# Patient Record
Sex: Male | Born: 1985 | Race: Black or African American | Hispanic: No | Marital: Single | Smoking: Never smoker
Health system: Southern US, Community
[De-identification: ages and names within clinical notes are randomized; demographics above are authoritative.]

---

## 2019-10-14 ENCOUNTER — Other Ambulatory Visit: Payer: Self-pay

## 2019-10-14 ENCOUNTER — Ambulatory Visit (INDEPENDENT_AMBULATORY_CARE_PROVIDER_SITE_OTHER): Payer: Worker's Compensation

## 2019-10-14 ENCOUNTER — Encounter (HOSPITAL_COMMUNITY): Payer: Self-pay

## 2019-10-14 ENCOUNTER — Ambulatory Visit (HOSPITAL_COMMUNITY)
Admission: EM | Admit: 2019-10-14 | Discharge: 2019-10-14 | Disposition: A | Discharge status: Home / Self Care | Payer: Worker's Compensation | Attending: Family Medicine | Admitting: Family Medicine

## 2019-10-14 ENCOUNTER — Ambulatory Visit (HOSPITAL_COMMUNITY): Payer: BC Managed Care – PPO

## 2019-10-14 DIAGNOSIS — M25522 Pain in left elbow: Secondary | ICD-10-CM | POA: Diagnosis not present

## 2019-10-14 DIAGNOSIS — X500XXA Overexertion from strenuous movement or load, initial encounter: Secondary | ICD-10-CM

## 2019-10-14 DIAGNOSIS — R030 Elevated blood-pressure reading, without diagnosis of hypertension: Secondary | ICD-10-CM

## 2019-10-14 MED ORDER — DICLOFENAC SODIUM 75 MG PO TBEC: 75.0000 mg | DELAYED_RELEASE_TABLET | Freq: Two times a day (BID) | ORAL | 0 refills | Status: AC

## 2019-10-14 NOTE — ED Triage Notes (Signed)
Patient presents to Urgent Care with complaints of left elbow pain since lifting something heavy yesterday. Patient reports he heard a "pop", and the pain has been severe ever since, states pain is 10/10.

## 2019-10-14 NOTE — Discharge Instructions (Addendum)
Your blood pressure was noted to be elevated during your visit today. You may return here within the next few days to recheck if unable to see your primary care doctor. If your blood pressure remains persistently elevated, you may need to begin taking a medication.  BP (!) 153/84 (BP Location: Right Arm)   Pulse 61   Temp 97.8 F (36.6 C) (Oral)   Resp 17   SpO2 99%

## 2019-10-16 NOTE — ED Provider Notes (Signed)
[place]   [id number] 10/14/19 Arrival Time: [place]:  1. Left elbow pain   2. Elevated blood pressure reading without diagnosis of hypertension     I have personally viewed the imaging studies ordered this visit. No fractures or other abnormalities appreciated.  Question L biceps muscle strain; no signs of a rupture. Rest L arm; work note given. Begin trial of: Meds ordered this encounter  Medications  . diclofenac (VOLTAREN) 75 MG EC tablet    Sig: Take 1 tablet (75 mg total) by mouth 2 (two) times daily.    Dispense:  14 tablet    Refill:  0    Orders Placed This Encounter  Procedures  . DG Elbow Complete Left    Recommend: Follow-up Information    Schedule an appointment as soon as possible for a visit  with [facility].   Why: [address] [place], [place] [address]  [phone]           Discharge Instructions     Your blood pressure was noted to be elevated during your visit today. You may return here within the next few days to recheck if unable to see your primary care doctor. If your blood pressure remains persistently elevated, you may need to begin taking a medication.  BP (!) 153/84 (BP Location: Right Arm)   Pulse 61   Temp 97.8 F (36.6 C) (Oral)   Resp 17   SpO2 99%          Reviewed expectations re: course of current medical issues. Questions answered. Outlined signs and symptoms indicating need for more acute intervention. Patient verbalized understanding. After Visit Summary given.  SUBJECTIVE: History from: patient. Worker's comp injury.  Corey Koch is a 34 y.o. male who reports fairly persistent marked pain of his left elbow after lifting an appliance onto a truck with co-worker helping. Yesterday. Reports hearing "a pop" and feeling pain in elbow. Pain described as aching and burning; without radiation. Reports pain is 10/10. Symptoms have progressed to a  point and plateaued since beginning. Aggravating factors: certain movements. Alleviating factors: have not been identified. Associated symptoms: none reported. Extremity sensation changes or weakness: none. Self treatment: none reported.  History of similar: no.  History reviewed. No pertinent surgical history.   Increased blood pressure noted today. Reports that he has not been treated for hypertension in the past.  He reports no chest pain on exertion, no dyspnea on exertion, no swelling of ankles, no orthostatic dizziness or lightheadedness, no orthopnea or paroxysmal nocturnal dyspnea, no palpitations and no intermittent claudication symptoms.  OBJECTIVE:  Vitals:   10/14/19 1326  BP: (!) 153/84  Pulse: 61  Resp: 17  Temp: 97.8 F (36.6 C)  TempSrc: Oral  SpO2: 99%    General appearance: alert; no distress HEENT: ; AT Neck: supple with FROM Resp: unlabored respirations Extremities: . LUE: warm with well perfused appearance; fairly well localized moderate tenderness over left elbow at distal biceps; without gross deformities; swelling: none; bruising: none; elbow ROM: normal, with discomfort CV: brisk extremity capillary refill of LUE; 2+ radial pulse of LUE. Skin: warm and dry; no visible rashes Neurologic: gait normal; normal sensation of LUE Psychological: alert and cooperative; normal mood and affect  Imaging: DG Elbow Complete Left  Result Date: 10/14/2019 CLINICAL DATA:  Injury with pain of left elbow. EXAM: LEFT ELBOW - COMPLETE 3+ VIEW COMPARISON:  None. FINDINGS: There is no evidence of fracture,  dislocation, or joint effusion. There is no evidence of arthropathy or other focal bone abnormality. Soft tissues are unremarkable. IMPRESSION: Negative. Electronically Signed   By: Sherian Rein M.D.   On: 10/14/2019 13:58     No Known Allergies  History reviewed. No pertinent past medical history.   Social History   Socioeconomic History  . Marital status:  Married    Spouse name: Not on file  . Number of children: Not on file  . Years of education: Not on file  . Highest education level: Not on file  Occupational History  . Not on file  Tobacco Use  . Smoking status: Never Smoker  . Smokeless tobacco: Never Used  Substance and Sexual Activity  . Alcohol use: Yes    Comment: occ  . Drug use: Not on file  . Sexual activity: Not on file  Other Topics Concern  . Not on file  Social History Narrative  . Not on file   Social Determinants of Health   Financial Resource Strain:   . Difficulty of Paying Living Expenses: Not on file  Food Insecurity:   . Worried About Programme researcher, broadcasting/film/video in the Last Year: Not on file  . Ran Out of Food in the Last Year: Not on file  Transportation Needs:   . Lack of Transportation (Medical): Not on file  . Lack of Transportation (Non-Medical): Not on file  Physical Activity:   . Days of Exercise per Week: Not on file  . Minutes of Exercise per Session: Not on file  Stress:   . Feeling of Stress : Not on file  Social Connections:   . Frequency of Communication with Friends and Family: Not on file  . Frequency of Social Gatherings with Friends and Family: Not on file  . Attends Religious Services: Not on file  . Active Member of Clubs or Organizations: Not on file  . Attends Banker Meetings: Not on file  . Marital Status: Not on file   Family History  Problem Relation Age of Onset  . Hypertension Mother   . Hypertension Father    History reviewed. No pertinent surgical history.    Mardella Layman, MD 10/16/19 [phone]

## 2020-03-18 DIAGNOSIS — Z20822 Contact with and (suspected) exposure to covid-19: Secondary | ICD-10-CM | POA: Diagnosis not present

## 2020-07-06 DIAGNOSIS — H6693 Otitis media, unspecified, bilateral: Secondary | ICD-10-CM | POA: Diagnosis not present

## 2020-07-23 DIAGNOSIS — E663 Overweight: Secondary | ICD-10-CM | POA: Diagnosis not present

## 2020-07-23 DIAGNOSIS — Z8342 Family history of familial hypercholesterolemia: Secondary | ICD-10-CM | POA: Diagnosis not present

## 2020-07-23 DIAGNOSIS — Z7185 Encounter for immunization safety counseling: Secondary | ICD-10-CM | POA: Diagnosis not present

## 2020-07-23 DIAGNOSIS — Z8249 Family history of ischemic heart disease and other diseases of the circulatory system: Secondary | ICD-10-CM | POA: Diagnosis not present

## 2020-07-23 DIAGNOSIS — Z23 Encounter for immunization: Secondary | ICD-10-CM | POA: Diagnosis not present

## 2020-07-31 DIAGNOSIS — Z13220 Encounter for screening for lipoid disorders: Secondary | ICD-10-CM | POA: Diagnosis not present

## 2020-07-31 DIAGNOSIS — Z0000 Encounter for general adult medical examination without abnormal findings: Secondary | ICD-10-CM | POA: Diagnosis not present

## 2020-08-02 DIAGNOSIS — Z0000 Encounter for general adult medical examination without abnormal findings: Secondary | ICD-10-CM | POA: Diagnosis not present

## 2020-09-29 DIAGNOSIS — Z20822 Contact with and (suspected) exposure to covid-19: Secondary | ICD-10-CM | POA: Diagnosis not present

## 2020-10-17 DIAGNOSIS — H6693 Otitis media, unspecified, bilateral: Secondary | ICD-10-CM | POA: Diagnosis not present

## 2020-11-29 DIAGNOSIS — H608X3 Other otitis externa, bilateral: Secondary | ICD-10-CM | POA: Diagnosis not present

## 2020-11-29 DIAGNOSIS — H6122 Impacted cerumen, left ear: Secondary | ICD-10-CM | POA: Diagnosis not present

## 2021-01-09 DIAGNOSIS — Z20822 Contact with and (suspected) exposure to covid-19: Secondary | ICD-10-CM | POA: Diagnosis not present

## 2021-02-28 IMAGING — DX DG ELBOW COMPLETE 3+V*L*
4 series · 4 of 4 positions shown · non-contrast
Comparison: None.

CLINICAL DATA: Injury with pain of left elbow.

EXAM:
LEFT ELBOW - COMPLETE 3+ VIEW

[elbow ap]
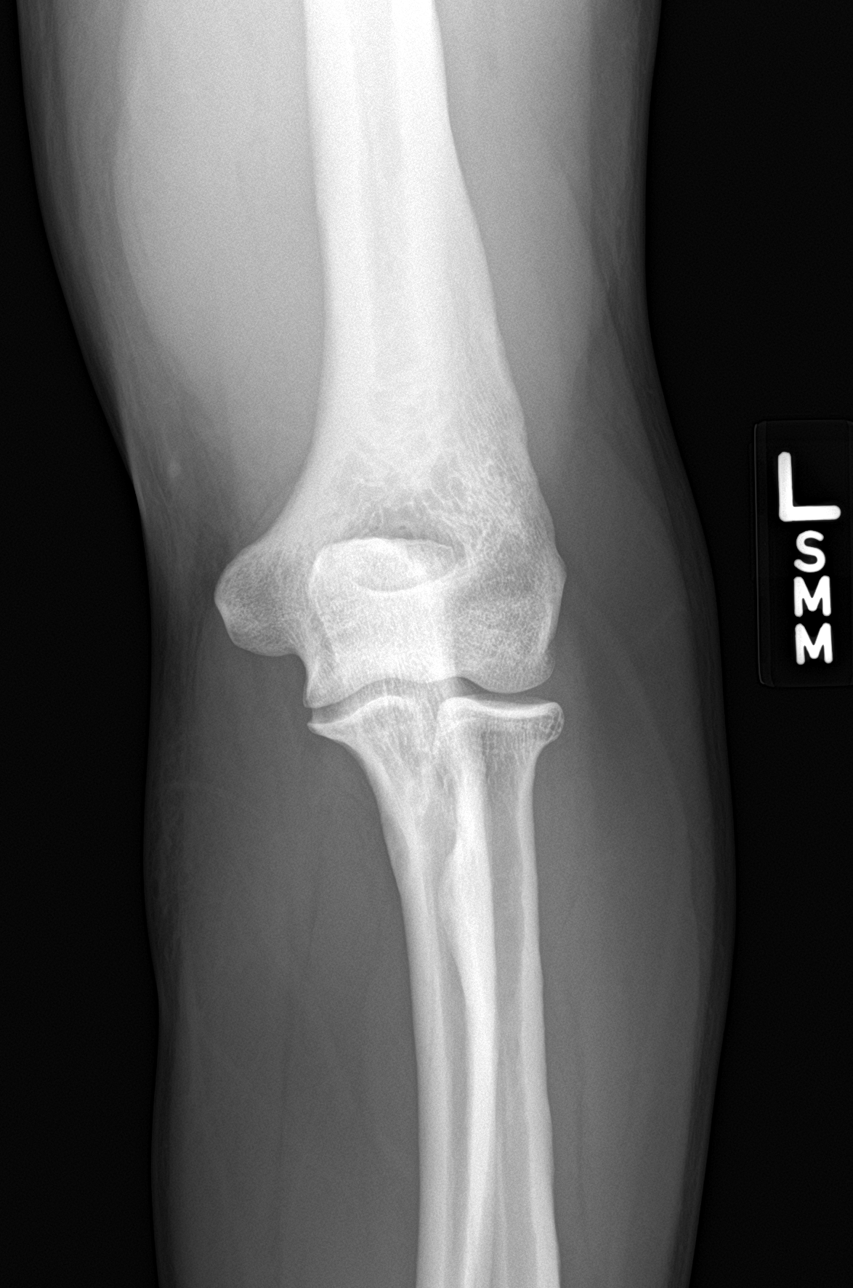

[elbow obl (1 of 2)]
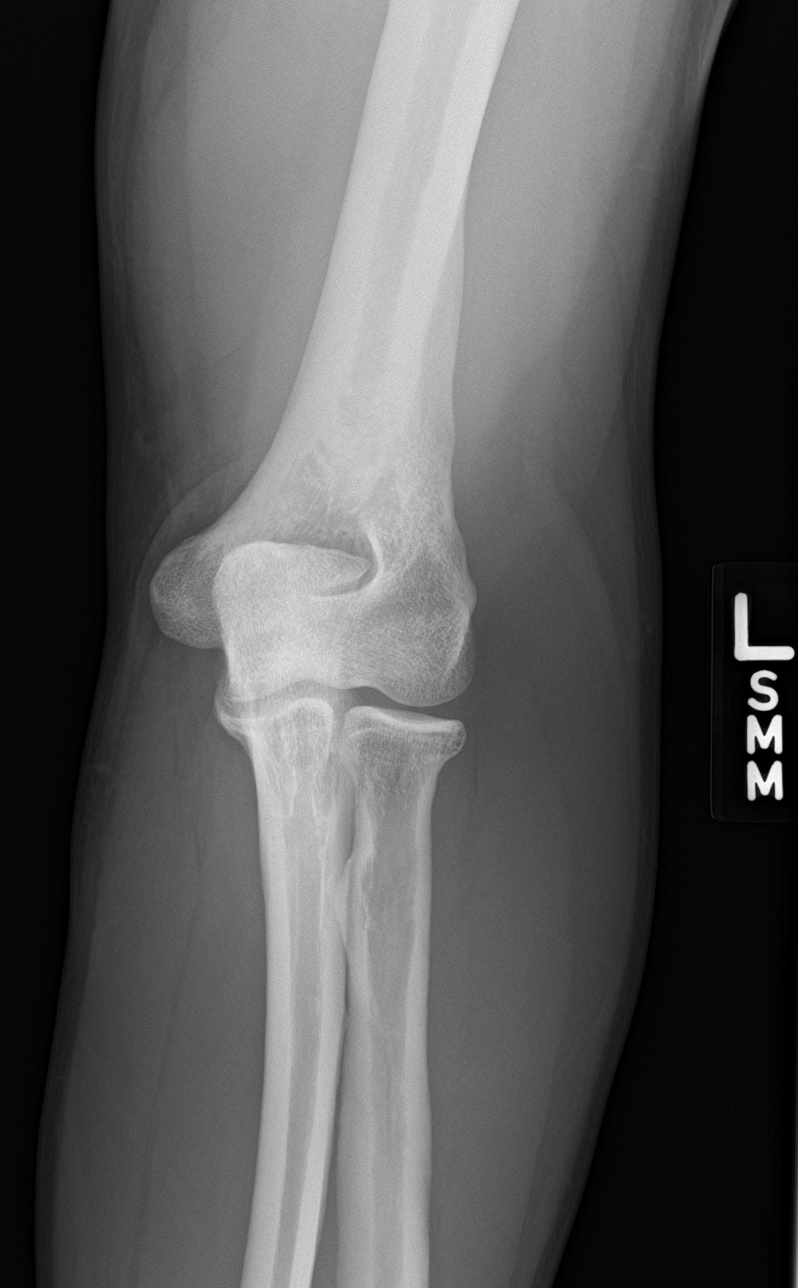

[elbow obl (2 of 2)]
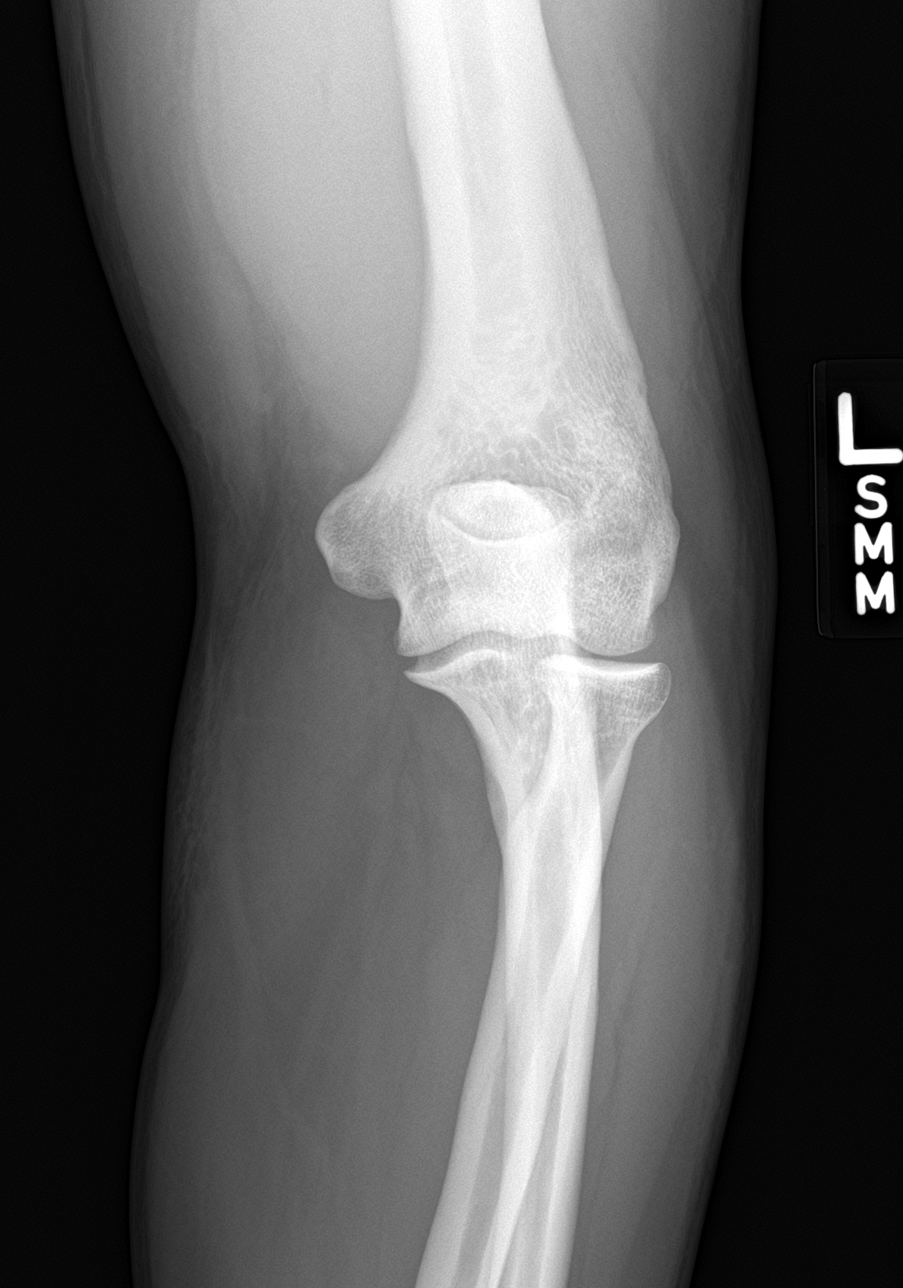

[elbow lat]
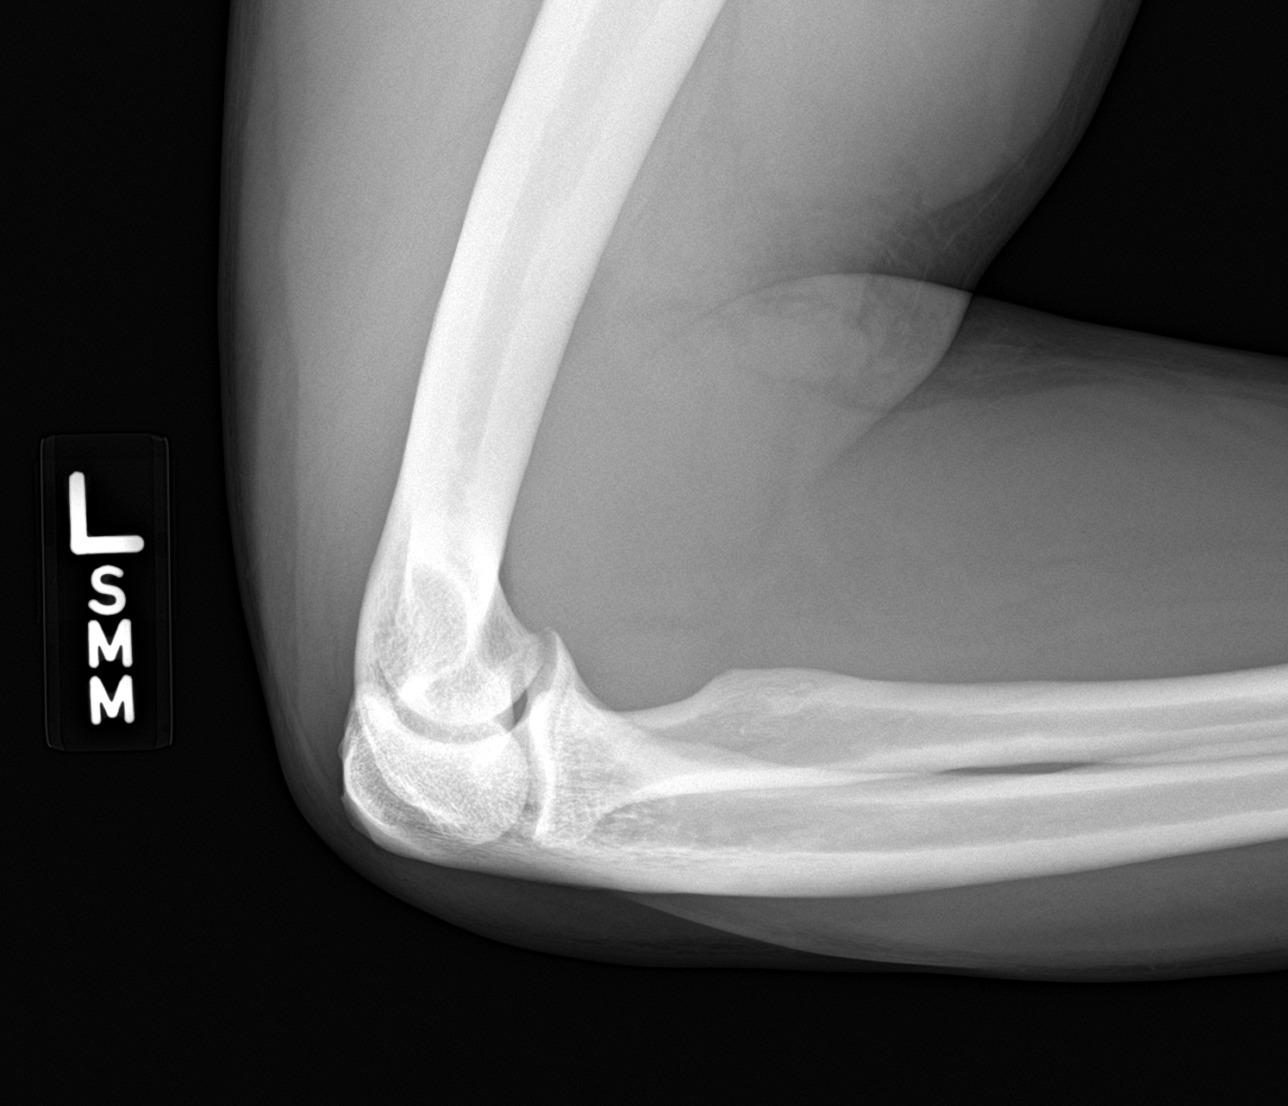

[4 of 4 positions shown; findings below may reference images not displayed]

FINDINGS: There is no evidence of fracture, dislocation, or joint effusion.
There is no evidence of arthropathy or other focal bone abnormality.
Soft tissues are unremarkable.
IMPRESSION: Negative.

## 2021-04-07 DIAGNOSIS — L309 Dermatitis, unspecified: Secondary | ICD-10-CM | POA: Diagnosis not present

## 2021-05-22 DIAGNOSIS — L209 Atopic dermatitis, unspecified: Secondary | ICD-10-CM | POA: Diagnosis not present

## 2021-05-22 DIAGNOSIS — L309 Dermatitis, unspecified: Secondary | ICD-10-CM | POA: Diagnosis not present

## 2021-06-18 DIAGNOSIS — L309 Dermatitis, unspecified: Secondary | ICD-10-CM | POA: Diagnosis not present

## 2021-06-25 DIAGNOSIS — J301 Allergic rhinitis due to pollen: Secondary | ICD-10-CM | POA: Diagnosis not present

## 2021-06-25 DIAGNOSIS — J3089 Other allergic rhinitis: Secondary | ICD-10-CM | POA: Diagnosis not present

## 2021-06-25 DIAGNOSIS — J3081 Allergic rhinitis due to animal (cat) (dog) hair and dander: Secondary | ICD-10-CM | POA: Diagnosis not present

## 2021-06-25 DIAGNOSIS — L309 Dermatitis, unspecified: Secondary | ICD-10-CM | POA: Diagnosis not present

## 2021-07-02 DIAGNOSIS — L249 Irritant contact dermatitis, unspecified cause: Secondary | ICD-10-CM | POA: Diagnosis not present

## 2021-08-07 DIAGNOSIS — Z0000 Encounter for general adult medical examination without abnormal findings: Secondary | ICD-10-CM | POA: Diagnosis not present

## 2021-08-26 DIAGNOSIS — Z0000 Encounter for general adult medical examination without abnormal findings: Secondary | ICD-10-CM | POA: Diagnosis not present

## 2021-08-26 DIAGNOSIS — R635 Abnormal weight gain: Secondary | ICD-10-CM | POA: Diagnosis not present

## 2021-08-26 DIAGNOSIS — Z23 Encounter for immunization: Secondary | ICD-10-CM | POA: Diagnosis not present

## 2021-08-26 DIAGNOSIS — R030 Elevated blood-pressure reading, without diagnosis of hypertension: Secondary | ICD-10-CM | POA: Diagnosis not present

## 2022-08-09 DIAGNOSIS — J019 Acute sinusitis, unspecified: Secondary | ICD-10-CM | POA: Diagnosis not present

## 2022-08-09 DIAGNOSIS — H1033 Unspecified acute conjunctivitis, bilateral: Secondary | ICD-10-CM | POA: Diagnosis not present

## 2022-08-25 DIAGNOSIS — R7301 Impaired fasting glucose: Secondary | ICD-10-CM | POA: Diagnosis not present

## 2022-08-25 DIAGNOSIS — Z114 Encounter for screening for human immunodeficiency virus [HIV]: Secondary | ICD-10-CM | POA: Diagnosis not present

## 2022-08-25 DIAGNOSIS — Z13220 Encounter for screening for lipoid disorders: Secondary | ICD-10-CM | POA: Diagnosis not present

## 2022-08-25 DIAGNOSIS — Z0000 Encounter for general adult medical examination without abnormal findings: Secondary | ICD-10-CM | POA: Diagnosis not present

## 2022-08-27 DIAGNOSIS — Z0000 Encounter for general adult medical examination without abnormal findings: Secondary | ICD-10-CM | POA: Diagnosis not present

## 2022-08-27 DIAGNOSIS — Z23 Encounter for immunization: Secondary | ICD-10-CM | POA: Diagnosis not present

## 2022-11-24 DIAGNOSIS — E1165 Type 2 diabetes mellitus with hyperglycemia: Secondary | ICD-10-CM | POA: Diagnosis not present

## 2022-11-24 DIAGNOSIS — E559 Vitamin D deficiency, unspecified: Secondary | ICD-10-CM | POA: Diagnosis not present

## 2022-11-26 DIAGNOSIS — R7303 Prediabetes: Secondary | ICD-10-CM | POA: Diagnosis not present

## 2022-11-26 DIAGNOSIS — E559 Vitamin D deficiency, unspecified: Secondary | ICD-10-CM | POA: Diagnosis not present

## 2022-11-26 DIAGNOSIS — R7989 Other specified abnormal findings of blood chemistry: Secondary | ICD-10-CM | POA: Diagnosis not present

## 2022-12-24 DIAGNOSIS — E559 Vitamin D deficiency, unspecified: Secondary | ICD-10-CM | POA: Diagnosis not present

## 2022-12-24 DIAGNOSIS — Z713 Dietary counseling and surveillance: Secondary | ICD-10-CM | POA: Diagnosis not present

## 2023-01-21 DIAGNOSIS — R0683 Snoring: Secondary | ICD-10-CM | POA: Diagnosis not present

## 2023-01-21 DIAGNOSIS — Z713 Dietary counseling and surveillance: Secondary | ICD-10-CM | POA: Diagnosis not present

## 2023-01-21 DIAGNOSIS — Z72821 Inadequate sleep hygiene: Secondary | ICD-10-CM | POA: Diagnosis not present

## 2023-02-23 DIAGNOSIS — I10 Essential (primary) hypertension: Secondary | ICD-10-CM | POA: Diagnosis not present

## 2023-02-23 DIAGNOSIS — R7303 Prediabetes: Secondary | ICD-10-CM | POA: Diagnosis not present

## 2023-02-25 DIAGNOSIS — R7303 Prediabetes: Secondary | ICD-10-CM | POA: Diagnosis not present

## 2023-02-25 DIAGNOSIS — R7989 Other specified abnormal findings of blood chemistry: Secondary | ICD-10-CM | POA: Diagnosis not present

## 2023-03-26 DIAGNOSIS — R030 Elevated blood-pressure reading, without diagnosis of hypertension: Secondary | ICD-10-CM | POA: Diagnosis not present

## 2023-03-26 DIAGNOSIS — R635 Abnormal weight gain: Secondary | ICD-10-CM | POA: Diagnosis not present

## 2023-04-08 ENCOUNTER — Other Ambulatory Visit (HOSPITAL_COMMUNITY): Payer: Self-pay

## 2023-04-08 MED ORDER — SEMAGLUTIDE-WEIGHT MANAGEMENT 1 MG/0.5ML ~~LOC~~ SOAJ
1.0000 mg | SUBCUTANEOUS | 3 refills | Status: AC
  Filled 2023-04-08: qty 2, 28d supply, fill #0

## 2023-04-29 ENCOUNTER — Other Ambulatory Visit (HOSPITAL_COMMUNITY): Payer: Self-pay

## 2023-04-29 DIAGNOSIS — R7303 Prediabetes: Secondary | ICD-10-CM | POA: Diagnosis not present

## 2023-04-29 DIAGNOSIS — R030 Elevated blood-pressure reading, without diagnosis of hypertension: Secondary | ICD-10-CM | POA: Diagnosis not present

## 2023-04-29 MED ORDER — WEGOVY 1 MG/0.5ML ~~LOC~~ SOAJ
1.0000 mg | SUBCUTANEOUS | 3 refills | Status: AC
  Filled 2023-04-29: qty 2, 28d supply, fill #0

## 2023-05-05 ENCOUNTER — Other Ambulatory Visit (HOSPITAL_COMMUNITY): Payer: Self-pay

## 2023-05-27 ENCOUNTER — Other Ambulatory Visit (HOSPITAL_COMMUNITY): Payer: Self-pay

## 2023-05-27 DIAGNOSIS — R7303 Prediabetes: Secondary | ICD-10-CM | POA: Diagnosis not present

## 2023-05-27 MED ORDER — WEGOVY 1.7 MG/0.75ML ~~LOC~~ SOAJ
0.7500 mL | SUBCUTANEOUS | 1 refills | Status: DC
  Filled 2023-05-27 – 2023-05-28 (×2): qty 3, 28d supply, fill #0
  Filled 2023-07-05 – 2023-07-21 (×6): qty 3, 28d supply, fill #1

## 2023-05-28 ENCOUNTER — Other Ambulatory Visit: Payer: Self-pay

## 2023-05-28 ENCOUNTER — Other Ambulatory Visit (HOSPITAL_COMMUNITY): Payer: Self-pay

## 2023-05-31 ENCOUNTER — Other Ambulatory Visit (HOSPITAL_COMMUNITY): Payer: Self-pay

## 2023-07-01 DIAGNOSIS — R030 Elevated blood-pressure reading, without diagnosis of hypertension: Secondary | ICD-10-CM | POA: Diagnosis not present

## 2023-07-05 ENCOUNTER — Other Ambulatory Visit (HOSPITAL_COMMUNITY): Payer: Self-pay

## 2023-07-06 ENCOUNTER — Other Ambulatory Visit (HOSPITAL_COMMUNITY): Payer: Self-pay

## 2023-07-08 ENCOUNTER — Other Ambulatory Visit (HOSPITAL_COMMUNITY): Payer: Self-pay

## 2023-07-09 DIAGNOSIS — Z202 Contact with and (suspected) exposure to infections with a predominantly sexual mode of transmission: Secondary | ICD-10-CM | POA: Diagnosis not present

## 2023-07-09 DIAGNOSIS — J039 Acute tonsillitis, unspecified: Secondary | ICD-10-CM | POA: Diagnosis not present

## 2023-07-21 ENCOUNTER — Other Ambulatory Visit (HOSPITAL_COMMUNITY): Payer: Self-pay

## 2023-08-10 ENCOUNTER — Other Ambulatory Visit (HOSPITAL_COMMUNITY): Payer: Self-pay

## 2023-08-10 DIAGNOSIS — L309 Dermatitis, unspecified: Secondary | ICD-10-CM | POA: Diagnosis not present

## 2023-08-10 DIAGNOSIS — R7303 Prediabetes: Secondary | ICD-10-CM | POA: Diagnosis not present

## 2023-08-10 MED ORDER — TRIAMCINOLONE ACETONIDE 0.5 % EX CREA
1.0000 | TOPICAL_CREAM | Freq: Every day | CUTANEOUS | 2 refills | Status: AC
  Filled 2023-08-10: qty 30, 30d supply, fill #0
  Filled 2023-08-10: qty 45, 30d supply, fill #0

## 2023-08-10 MED ORDER — WEGOVY 1.7 MG/0.75ML ~~LOC~~ SOAJ
1.7000 mg | SUBCUTANEOUS | 1 refills | Status: AC
  Filled 2023-08-10 – 2023-08-19 (×3): qty 3, 28d supply, fill #0
  Filled 2023-09-11: qty 3, 28d supply, fill #1

## 2023-08-10 MED ORDER — TRIAMCINOLONE ACETONIDE 0.5 % EX CREA
1.0000 | TOPICAL_CREAM | Freq: Two times a day (BID) | CUTANEOUS | 2 refills | Status: AC
  Filled 2023-08-10: qty 30, 15d supply, fill #0
  Filled 2023-08-10: qty 45, 23d supply, fill #0

## 2023-08-11 ENCOUNTER — Other Ambulatory Visit (HOSPITAL_COMMUNITY): Payer: Self-pay

## 2023-08-19 ENCOUNTER — Other Ambulatory Visit (HOSPITAL_COMMUNITY): Payer: Self-pay

## 2023-08-26 DIAGNOSIS — Z13220 Encounter for screening for lipoid disorders: Secondary | ICD-10-CM | POA: Diagnosis not present

## 2023-08-26 DIAGNOSIS — Z114 Encounter for screening for human immunodeficiency virus [HIV]: Secondary | ICD-10-CM | POA: Diagnosis not present

## 2023-08-26 DIAGNOSIS — Z0000 Encounter for general adult medical examination without abnormal findings: Secondary | ICD-10-CM | POA: Diagnosis not present

## 2023-08-30 DIAGNOSIS — Z0000 Encounter for general adult medical examination without abnormal findings: Secondary | ICD-10-CM | POA: Diagnosis not present

## 2023-08-30 DIAGNOSIS — R7989 Other specified abnormal findings of blood chemistry: Secondary | ICD-10-CM | POA: Diagnosis not present

## 2023-08-30 DIAGNOSIS — Z23 Encounter for immunization: Secondary | ICD-10-CM | POA: Diagnosis not present

## 2023-09-29 DIAGNOSIS — D649 Anemia, unspecified: Secondary | ICD-10-CM | POA: Diagnosis not present

## 2023-09-29 DIAGNOSIS — R5383 Other fatigue: Secondary | ICD-10-CM | POA: Diagnosis not present

## 2023-10-04 ENCOUNTER — Other Ambulatory Visit (HOSPITAL_COMMUNITY): Payer: Self-pay

## 2023-10-04 DIAGNOSIS — I10 Essential (primary) hypertension: Secondary | ICD-10-CM | POA: Diagnosis not present

## 2023-10-04 DIAGNOSIS — R030 Elevated blood-pressure reading, without diagnosis of hypertension: Secondary | ICD-10-CM | POA: Diagnosis not present

## 2023-10-04 DIAGNOSIS — R718 Other abnormality of red blood cells: Secondary | ICD-10-CM | POA: Diagnosis not present

## 2023-10-04 MED ORDER — WEGOVY 2.4 MG/0.75ML ~~LOC~~ SOAJ
2.4000 mg | SUBCUTANEOUS | 1 refills | Status: DC
  Filled 2023-10-04: qty 3, 28d supply, fill #0
  Filled 2023-10-27 – 2023-11-10 (×2): qty 3, 28d supply, fill #1

## 2023-11-02 DIAGNOSIS — J358 Other chronic diseases of tonsils and adenoids: Secondary | ICD-10-CM | POA: Diagnosis not present

## 2023-11-09 ENCOUNTER — Other Ambulatory Visit (HOSPITAL_COMMUNITY): Payer: Self-pay

## 2023-11-10 ENCOUNTER — Other Ambulatory Visit (HOSPITAL_COMMUNITY): Payer: Self-pay

## 2023-11-24 ENCOUNTER — Other Ambulatory Visit (HOSPITAL_COMMUNITY): Payer: Self-pay

## 2023-11-24 DIAGNOSIS — M545 Low back pain, unspecified: Secondary | ICD-10-CM | POA: Diagnosis not present

## 2023-11-24 DIAGNOSIS — M25519 Pain in unspecified shoulder: Secondary | ICD-10-CM | POA: Diagnosis not present

## 2023-11-24 MED ORDER — CYCLOBENZAPRINE HCL 5 MG PO TABS
5.0000 mg | ORAL_TABLET | Freq: Every evening | ORAL | 0 refills | Status: AC
  Filled 2023-11-24: qty 30, 15d supply, fill #0

## 2023-11-24 MED ORDER — WEGOVY 2.4 MG/0.75ML ~~LOC~~ SOAJ
2.4000 mg | SUBCUTANEOUS | 1 refills | Status: AC
  Filled 2023-11-24 – 2023-12-01 (×2): qty 3, 28d supply, fill #0
  Filled 2024-06-12 – 2024-07-20 (×2): qty 3, 28d supply, fill #1

## 2023-11-25 ENCOUNTER — Other Ambulatory Visit (HOSPITAL_COMMUNITY): Payer: Self-pay

## 2023-11-25 DIAGNOSIS — J3503 Chronic tonsillitis and adenoiditis: Secondary | ICD-10-CM | POA: Diagnosis not present

## 2023-11-25 MED ORDER — CEFDINIR 300 MG PO CAPS
300.0000 mg | ORAL_CAPSULE | Freq: Two times a day (BID) | ORAL | 0 refills | Status: AC
  Filled 2023-11-25: qty 14, 7d supply, fill #0

## 2023-12-01 ENCOUNTER — Other Ambulatory Visit (HOSPITAL_COMMUNITY): Payer: Self-pay

## 2023-12-01 ENCOUNTER — Other Ambulatory Visit: Payer: Self-pay

## 2024-04-13 ENCOUNTER — Other Ambulatory Visit (HOSPITAL_COMMUNITY): Payer: Self-pay

## 2024-04-13 DIAGNOSIS — I10 Essential (primary) hypertension: Secondary | ICD-10-CM | POA: Diagnosis not present

## 2024-04-13 DIAGNOSIS — R030 Elevated blood-pressure reading, without diagnosis of hypertension: Secondary | ICD-10-CM | POA: Diagnosis not present

## 2024-04-13 DIAGNOSIS — L309 Dermatitis, unspecified: Secondary | ICD-10-CM | POA: Diagnosis not present

## 2024-04-13 MED ORDER — WEGOVY 0.25 MG/0.5ML ~~LOC~~ SOAJ
0.2500 mg | SUBCUTANEOUS | 2 refills | Status: DC
  Filled 2024-04-13: qty 2, 28d supply, fill #0
  Filled 2024-05-11: qty 2, 28d supply, fill #1

## 2024-04-13 MED ORDER — TRIAMCINOLONE ACETONIDE 0.5 % EX CREA
1.0000 | TOPICAL_CREAM | Freq: Two times a day (BID) | CUTANEOUS | 2 refills | Status: AC
  Filled 2024-04-13: qty 45, 23d supply, fill #0

## 2024-04-13 MED ORDER — TRIAMCINOLONE ACETONIDE 0.1 % EX OINT
1.0000 | TOPICAL_OINTMENT | Freq: Two times a day (BID) | CUTANEOUS | 0 refills | Status: AC
  Filled 2024-04-13: qty 60, 30d supply, fill #0

## 2024-05-11 ENCOUNTER — Other Ambulatory Visit (HOSPITAL_COMMUNITY): Payer: Self-pay

## 2024-05-25 ENCOUNTER — Other Ambulatory Visit (HOSPITAL_COMMUNITY): Payer: Self-pay

## 2024-05-25 DIAGNOSIS — R030 Elevated blood-pressure reading, without diagnosis of hypertension: Secondary | ICD-10-CM | POA: Diagnosis not present

## 2024-05-25 DIAGNOSIS — L309 Dermatitis, unspecified: Secondary | ICD-10-CM | POA: Diagnosis not present

## 2024-05-25 DIAGNOSIS — I10 Essential (primary) hypertension: Secondary | ICD-10-CM | POA: Diagnosis not present

## 2024-05-25 DIAGNOSIS — G4700 Insomnia, unspecified: Secondary | ICD-10-CM | POA: Diagnosis not present

## 2024-05-25 MED ORDER — WEGOVY 0.5 MG/0.5ML ~~LOC~~ SOAJ
0.5000 mg | SUBCUTANEOUS | 4 refills | Status: AC
  Filled 2024-05-25 – 2024-06-12 (×3): qty 2, 28d supply, fill #0

## 2024-06-12 ENCOUNTER — Other Ambulatory Visit (HOSPITAL_COMMUNITY): Payer: Self-pay

## 2024-06-14 ENCOUNTER — Other Ambulatory Visit (HOSPITAL_COMMUNITY): Payer: Self-pay

## 2024-07-20 ENCOUNTER — Other Ambulatory Visit (HOSPITAL_COMMUNITY): Payer: Self-pay
